# Patient Record
Sex: Female | Born: 1950 | Race: Black or African American | Hispanic: No | Marital: Married | Smoking: Current every day smoker
Health system: Southern US, Community
[De-identification: ages and names within clinical notes are randomized; demographics above are authoritative.]

## PROBLEM LIST (undated history)

## (undated) DIAGNOSIS — J449 Chronic obstructive pulmonary disease, unspecified: Secondary | ICD-10-CM

## (undated) DIAGNOSIS — I10 Essential (primary) hypertension: Secondary | ICD-10-CM

## (undated) HISTORY — PX: TUBAL LIGATION: SHX77

---

## 2015-05-01 ENCOUNTER — Emergency Department (HOSPITAL_BASED_OUTPATIENT_CLINIC_OR_DEPARTMENT_OTHER): Payer: BLUE CROSS/BLUE SHIELD

## 2015-05-01 ENCOUNTER — Encounter (HOSPITAL_BASED_OUTPATIENT_CLINIC_OR_DEPARTMENT_OTHER): Payer: Self-pay | Admitting: *Deleted

## 2015-05-01 ENCOUNTER — Emergency Department (HOSPITAL_BASED_OUTPATIENT_CLINIC_OR_DEPARTMENT_OTHER)
Admission: EM | Admit: 2015-05-01 | Discharge: 2015-05-01 | Disposition: A | Discharge status: Home / Self Care | Payer: BLUE CROSS/BLUE SHIELD | Attending: Emergency Medicine | Admitting: Emergency Medicine

## 2015-05-01 DIAGNOSIS — J441 Chronic obstructive pulmonary disease with (acute) exacerbation: Secondary | ICD-10-CM | POA: Diagnosis not present

## 2015-05-01 DIAGNOSIS — R0789 Other chest pain: Secondary | ICD-10-CM | POA: Insufficient documentation

## 2015-05-01 DIAGNOSIS — I10 Essential (primary) hypertension: Secondary | ICD-10-CM | POA: Diagnosis not present

## 2015-05-01 DIAGNOSIS — F17210 Nicotine dependence, cigarettes, uncomplicated: Secondary | ICD-10-CM | POA: Diagnosis not present

## 2015-05-01 DIAGNOSIS — R0602 Shortness of breath: Secondary | ICD-10-CM | POA: Diagnosis present

## 2015-05-01 DIAGNOSIS — R0600 Dyspnea, unspecified: Secondary | ICD-10-CM

## 2015-05-01 HISTORY — DX: Essential (primary) hypertension: I10

## 2015-05-01 HISTORY — DX: Chronic obstructive pulmonary disease, unspecified: J44.9

## 2015-05-01 LAB — COMPREHENSIVE METABOLIC PANEL
ALT: 24 U/L (ref 14–54)
ANION GAP: 6 (ref 5–15)
AST: 32 U/L (ref 15–41)
Albumin: 4.1 g/dL (ref 3.5–5.0)
Alkaline Phosphatase: 96 U/L (ref 38–126)
BUN: 10 mg/dL (ref 6–20)
CALCIUM: 9.1 mg/dL (ref 8.9–10.3)
CHLORIDE: 102 mmol/L (ref 101–111)
CO2: 31 mmol/L (ref 22–32)
CREATININE: 0.8 mg/dL (ref 0.44–1.00)
Glucose, Bld: 105 mg/dL — ABNORMAL HIGH (ref 65–99)
Potassium: 3.3 mmol/L — ABNORMAL LOW (ref 3.5–5.1)
SODIUM: 139 mmol/L (ref 135–145)
Total Bilirubin: 0.6 mg/dL (ref 0.3–1.2)
Total Protein: 8.3 g/dL — ABNORMAL HIGH (ref 6.5–8.1)

## 2015-05-01 LAB — CBC WITH DIFFERENTIAL/PLATELET
Basophils Absolute: 0 10*3/uL (ref 0.0–0.1)
Basophils Relative: 0 %
EOS ABS: 0.1 10*3/uL (ref 0.0–0.7)
EOS PCT: 1 %
HCT: 43.2 % (ref 36.0–46.0)
Hemoglobin: 13.3 g/dL (ref 12.0–15.0)
LYMPHS ABS: 1.5 10*3/uL (ref 0.7–4.0)
LYMPHS PCT: 16 %
MCH: 25.7 pg — AB (ref 26.0–34.0)
MCHC: 30.8 g/dL (ref 30.0–36.0)
MCV: 83.6 fL (ref 78.0–100.0)
MONO ABS: 1.2 10*3/uL — AB (ref 0.1–1.0)
MONOS PCT: 13 %
Neutro Abs: 6.7 10*3/uL (ref 1.7–7.7)
Neutrophils Relative %: 70 %
PLATELETS: 164 10*3/uL (ref 150–400)
RBC: 5.17 MIL/uL — ABNORMAL HIGH (ref 3.87–5.11)
RDW: 15.3 % (ref 11.5–15.5)
WBC: 9.5 10*3/uL (ref 4.0–10.5)

## 2015-05-01 LAB — TROPONIN I

## 2015-05-01 MED ORDER — PREDNISONE 10 MG PO TABS: 20.0000 mg | ORAL_TABLET | Freq: Two times a day (BID) | ORAL | Status: AC

## 2015-05-01 MED ORDER — ALBUTEROL SULFATE HFA 108 (90 BASE) MCG/ACT IN AERS
2.0000 | INHALATION_SPRAY | RESPIRATORY_TRACT | Status: DC | PRN
  Administered 2015-05-01: 2 via RESPIRATORY_TRACT
  Filled 2015-05-01: qty 6.7

## 2015-05-01 MED ORDER — ALBUTEROL SULFATE (2.5 MG/3ML) 0.083% IN NEBU
5.0000 mg | INHALATION_SOLUTION | Freq: Once | RESPIRATORY_TRACT | Status: AC
  Administered 2015-05-01: 5 mg via RESPIRATORY_TRACT
  Filled 2015-05-01: qty 6

## 2015-05-01 MED ORDER — METHYLPREDNISOLONE SODIUM SUCC 125 MG IJ SOLR
125.0000 mg | Freq: Once | INTRAMUSCULAR | Status: AC
  Administered 2015-05-01: 125 mg via INTRAVENOUS
  Filled 2015-05-01: qty 2

## 2015-05-01 MED ORDER — ALBUTEROL SULFATE (2.5 MG/3ML) 0.083% IN NEBU
5.0000 mg | INHALATION_SOLUTION | Freq: Once | RESPIRATORY_TRACT | Status: DC
  Filled 2015-05-01: qty 6

## 2015-05-01 MED ORDER — ALBUTEROL SULFATE (2.5 MG/3ML) 0.083% IN NEBU
5.0000 mg | INHALATION_SOLUTION | Freq: Once | RESPIRATORY_TRACT | Status: AC
  Administered 2015-05-01: 5 mg via RESPIRATORY_TRACT

## 2015-05-01 NOTE — ED Notes (Signed)
Instructed patient of use of albuterol mdi with spacer. No questions at this time.

## 2015-05-01 NOTE — Discharge Instructions (Signed)
Prednisone as prescribed.  Continue your albuterol as needed for wheezing.  Call the cardiology clinic to schedule a follow-up appointment to discuss a stress test.  Return to the ER symptoms significantly worsen or change.   Nonspecific Chest Pain  Chest pain can be caused by many different conditions. There is always a chance that your pain could be related to something serious, such as a heart attack or a blood clot in your lungs. Chest pain can also be caused by conditions that are not life-threatening. If you have chest pain, it is very important to follow up with your health care provider. CAUSES  Chest pain can be caused by:  Heartburn.  Pneumonia or bronchitis.  Anxiety or stress.  Inflammation around your heart (pericarditis) or lung (pleuritis or pleurisy).  A blood clot in your lung.  A collapsed lung (pneumothorax). It can develop suddenly on its own (spontaneous pneumothorax) or from trauma to the chest.  Shingles infection (varicella-zoster virus).  Heart attack.  Damage to the bones, muscles, and cartilage that make up your chest wall. This can include:  Bruised bones due to injury.  Strained muscles or cartilage due to frequent or repeated coughing or overwork.  Fracture to one or more ribs.  Sore cartilage due to inflammation (costochondritis). RISK FACTORS  Risk factors for chest pain may include:  Activities that increase your risk for trauma or injury to your chest.  Respiratory infections or conditions that cause frequent coughing.  Medical conditions or overeating that can cause heartburn.  Heart disease or family history of heart disease.  Conditions or health behaviors that increase your risk of developing a blood clot.  Having had chicken pox (varicella zoster). SIGNS AND SYMPTOMS Chest pain can feel like:  Burning or tingling on the surface of your chest or deep in your chest.  Crushing, pressure, aching, or squeezing pain.  Dull  or sharp pain that is worse when you move, cough, or take a deep breath.  Pain that is also felt in your back, neck, shoulder, or arm, or pain that spreads to any of these areas. Your chest pain may come and go, or it may stay constant. DIAGNOSIS Lab tests or other studies may be needed to find the cause of your pain. Your health care provider may have you take a test called an ambulatory ECG (electrocardiogram). An ECG records your heartbeat patterns at the time the test is performed. You may also have other tests, such as:  Transthoracic echocardiogram (TTE). During echocardiography, sound waves are used to create a picture of all of the heart structures and to look at how blood flows through your heart.  Transesophageal echocardiogram (TEE).This is a more advanced imaging test that obtains images from inside your body. It allows your health care provider to see your heart in finer detail.  Cardiac monitoring. This allows your health care provider to monitor your heart rate and rhythm in real time.  Holter monitor. This is a portable device that records your heartbeat and can help to diagnose abnormal heartbeats. It allows your health care provider to track your heart activity for several days, if needed.  Stress tests. These can be done through exercise or by taking medicine that makes your heart beat more quickly.  Blood tests.  Imaging tests. TREATMENT  Your treatment depends on what is causing your chest pain. Treatment may include:  Medicines. These may include:  Acid blockers for heartburn.  Anti-inflammatory medicine.  Pain medicine for inflammatory conditions.  Antibiotic medicine, if an infection is present.  Medicines to dissolve blood clots.  Medicines to treat coronary artery disease.  Supportive care for conditions that do not require medicines. This may include:  Resting.  Applying heat or cold packs to injured areas.  Limiting activities until pain  decreases. HOME CARE INSTRUCTIONS  If you were prescribed an antibiotic medicine, finish it all even if you start to feel better.  Avoid any activities that bring on chest pain.  Do not use any tobacco products, including cigarettes, chewing tobacco, or electronic cigarettes. If you need help quitting, ask your health care provider.  Do not drink alcohol.  Take medicines only as directed by your health care provider.  Keep all follow-up visits as directed by your health care provider. This is important. This includes any further testing if your chest pain does not go away.  If heartburn is the cause for your chest pain, you may be told to keep your head raised (elevated) while sleeping. This reduces the chance that acid will go from your stomach into your esophagus.  Make lifestyle changes as directed by your health care provider. These may include:  Getting regular exercise. Ask your health care provider to suggest some activities that are safe for you.  Eating a heart-healthy diet. A registered dietitian can help you to learn healthy eating options.  Maintaining a healthy weight.  Managing diabetes, if necessary.  Reducing stress. SEEK MEDICAL CARE IF:  Your chest pain does not go away after treatment.  You have a rash with blisters on your chest.  You have a fever. SEEK IMMEDIATE MEDICAL CARE IF:   Your chest pain is worse.  You have an increasing cough, or you cough up blood.  You have severe abdominal pain.  You have severe weakness.  You faint.  You have chills.  You have sudden, unexplained chest discomfort.  You have sudden, unexplained discomfort in your arms, back, neck, or jaw.  You have shortness of breath at any time.  You suddenly start to sweat, or your skin gets clammy.  You feel nauseous or you vomit.  You suddenly feel light-headed or dizzy.  Your heart begins to beat quickly, or it feels like it is skipping beats. These symptoms may  represent a serious problem that is an emergency. Do not wait to see if the symptoms will go away. Get medical help right away. Call your local emergency services (911 in the [place] Do not drive yourself to the hospital.   This information is not intended to replace advice given to you by your health care provider. Make sure you discuss any questions you have with your health care provider.   Document Released: 02/12/2005 Document Revised: 05/26/2014 Document Reviewed: 12/09/2013 Elsevier Interactive Patient Education [employer].

## 2015-05-01 NOTE — ED Provider Notes (Addendum)
CSN: [id number]     Arrival date & time 05/01/15  0813 History   First MD Initiated Contact with Patient 05/01/15 [phone]     Chief Complaint  Patient presents with  . Shortness of Breath     (Consider location/radiation/quality/duration/timing/severity/associated sxs/prior Treatment) HPI Comments: Patient is a 64 year old female with history of COPD, hypertension. She presents for evaluation of URI like symptoms including congestion, cough, fatigue. This is become worse over the past several days. She also reports that she has been having episodes of chest discomfort over the past several weeks. These episodes occur spontaneously and are not related to exertion or stress. She reports a sharp pain in her left upper chest that comes on and lasts for several minutes, then resolves. She denies any nausea, diaphoresis, or radiation to the arm or jaw when this discomfort occurs. She has felt somewhat short of breath recently. She has no calf pain or swelling. She denies any fevers or chills.  Patient is a 64 y.o. female presenting with shortness of breath. The history is provided by the patient.  Shortness of Breath Severity:  Moderate Onset quality:  Sudden Duration:  3 days Timing:  Constant Progression:  Worsening Chronicity:  New Relieved by:  Nothing Worsened by:  Nothing tried Ineffective treatments:  None tried   Past Medical History  Diagnosis Date  . COPD (chronic obstructive pulmonary disease) (HCC)   . Hypertension    Past Surgical History  Procedure Laterality Date  . Tubal ligation     No family history on file. Social History  Substance Use Topics  . Smoking status: Current Every Day Smoker -- 1.00 packs/day    Types: Cigarettes  . Smokeless tobacco: Never Used  . Alcohol Use: No   OB History    No data available     Review of Systems  Respiratory: Positive for shortness of breath.   All other systems reviewed and are negative.     Allergies  Review of  patient's allergies indicates no known allergies.  Home Medications   Prior to Admission medications   Medication Sig Start Date End Date Taking? Authorizing Provider  Cholecalciferol (VITAMIN D PO) Take by mouth.   Yes Historical Provider, MD  DiltiaZEM HCl ER Beads (TAZTIA XT PO) Take by mouth.   Yes Historical Provider, MD  HYDRALAZINE-HCTZ PO Take by mouth.   Yes Historical Provider, MD  Rosuvastatin Calcium (CRESTOR PO) Take by mouth.   Yes Historical Provider, MD   BP 139/84 mmHg  Pulse 114  Temp(Src) 98.6 F (37 C) (Oral)  Resp 18  Ht  (1.702 m)  Wt 196 lb (88.905 kg)  BMI 30.69 kg/m2  SpO2 89% Physical Exam  Constitutional: She is oriented to person, place, and time. She appears well-developed and well-nourished. No distress.  HENT:  Head: Normocephalic and atraumatic.  Neck: Normal range of motion. Neck supple.  Cardiovascular: Normal rate and regular rhythm.  Exam reveals no gallop and no friction rub.   No murmur heard. Pulmonary/Chest: Effort normal and breath sounds normal. No respiratory distress. She has no wheezes.  Abdominal: Soft. Bowel sounds are normal. She exhibits no distension. There is no tenderness.  Musculoskeletal: Normal range of motion. She exhibits no edema.  There is no calf pain, tenderness, or swelling. Homans sign is absent bilaterally.  Neurological: She is alert and oriented to person, place, and time.  Skin: Skin is warm and dry. She is not diaphoretic.  Nursing note and vitals reviewed.  ED Course  Procedures (including critical care time) Labs Review Labs Reviewed  CBC WITH DIFFERENTIAL/PLATELET - Abnormal; Notable for the following:    RBC 5.17 (*)    MCH 25.7 (*)    Monocytes Absolute 1.2 (*)    All other components within normal limits  COMPREHENSIVE METABOLIC PANEL  TROPONIN I    Imaging Review No results found. I have personally reviewed and evaluated these images and lab results as part of my medical  decision-making.  ED ECG REPORT   Date: 05/01/2015  Rate: 84  Rhythm: normal sinus rhythm  QRS Axis: normal  Intervals: normal  ST/T Wave abnormalities: normal  Conduction Disutrbances:none  Narrative Interpretation:   Old EKG Reviewed: none available  I have personally reviewed the EKG tracing and agree with the computerized printout as noted.   MDM   Final diagnoses:  None    Patient presents with complaints of intermittent sharp pain in her left upper chest which is been occurring for the past 2 months. She presents today with URI symptoms and feeling short of breath. Her workup reveals no acute abnormality. Her troponin is negative and laboratory studies are otherwise unremarkable. Her chest x-ray is clear. I doubt a cardiac etiology and suspect this is likely more musculoskeletal. She had mild hypoxia with ambulation and she was given a nebulizer treatment. She is now feeling much improved and I believe is appropriate for discharge. She will be treated with albuterol, steroids, and when necessary return.  I have considered pulmonary embolism, however highly doubt this as her history is inconsistent with PE.    Geoffery Lyonsouglas Denney Shein, MD 05/01/15 [phone]  Geoffery Lyonsouglas Haeley Fordham, MD 05/01/15 [phone]

## 2015-05-01 NOTE — ED Notes (Signed)
Pt up to bathroom, sats 88% when returned to room placed back on 2lpm [place].

## 2015-05-01 NOTE — ED Notes (Signed)
Patient states she developed sinus congestion, fatigue, chills, sneezing, and cough.  States she has progressively gotten worse.  Patient states she has had intermittent chest pain on multiple occassions, and states she is very concerned about her heart.  States she works for [employer], and was taken to the ED by her co-workers, but refused to be seen for her chest pain over the weekend.

## 2017-12-23 ENCOUNTER — Other Ambulatory Visit: Payer: Self-pay | Admitting: Surgical Oncology

## 2017-12-23 DIAGNOSIS — R921 Mammographic calcification found on diagnostic imaging of breast: Secondary | ICD-10-CM

## 2017-12-28 ENCOUNTER — Ambulatory Visit
Admission: RE | Admit: 2017-12-28 | Discharge: 2017-12-28 | Disposition: A | Discharge status: Home / Self Care | Payer: BLUE CROSS/BLUE SHIELD | Source: Ambulatory Visit | Attending: Surgical Oncology | Admitting: Surgical Oncology

## 2017-12-28 DIAGNOSIS — R921 Mammographic calcification found on diagnostic imaging of breast: Secondary | ICD-10-CM

## 2019-04-11 IMAGING — MG STEREOTACTIC VACUUM ASSIST RIGHT
8 of 11 series · 8 of 23 positions shown · non-contrast
Comparison: Previous exams.

ADDENDUM:
Pathology revealed FIBROADENOMA WITH CALCIFICATIONS, FIBROCYSTIC
CHANGES of RIGHT breast, upper outer. This was found to be
concordant by Dr. Bondo Daar.

Pathology results were provided to Wai Kuong Amelis, Breast Navigator,
[facility] [REDACTED], [HOSPITAL] [HOSPITAL], who will
discuss results with the patient by telephone.
The patient has a previous diagnosis of LEFT breast cancer and
should follow her outlined treatment plan.
Pathology and imaging reports will be faxed to Wai Kuong Amelis.
Pathology results reported by Kyu Barreda, RN on 12/29/2017.
CLINICAL DATA: Biopsy of right breast calcifications
EXAM:
RIGHT BREAST STEREOTACTIC CORE NEEDLE BIOPSY

[R (1 of 7)]
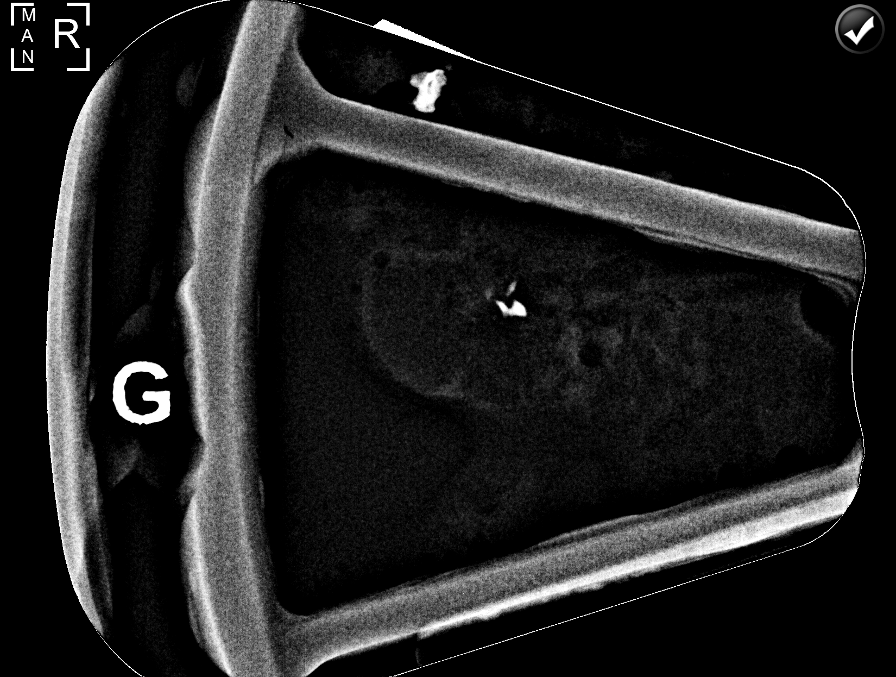

[R (2 of 7)]
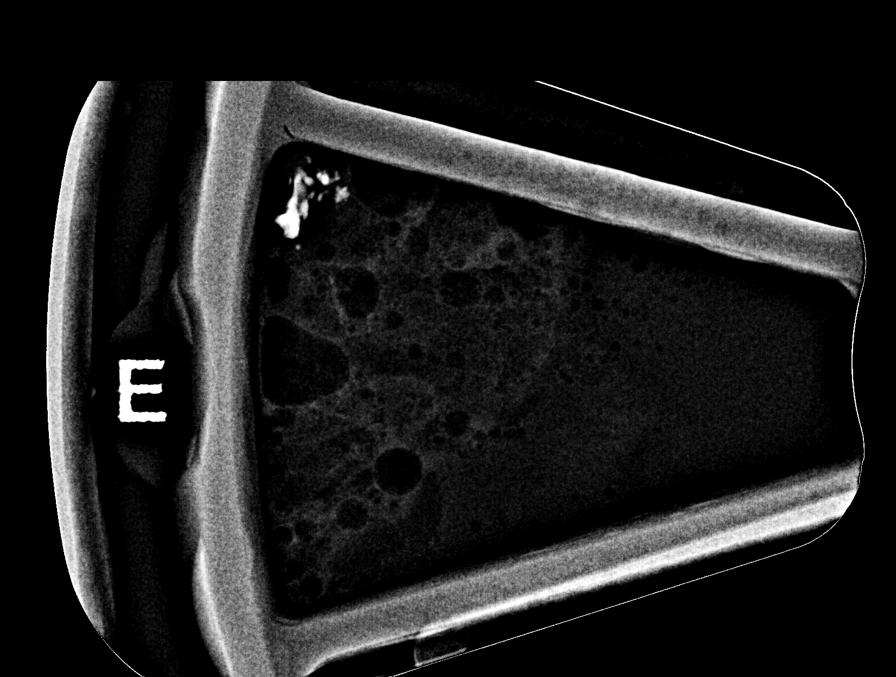

[R (3 of 7)]
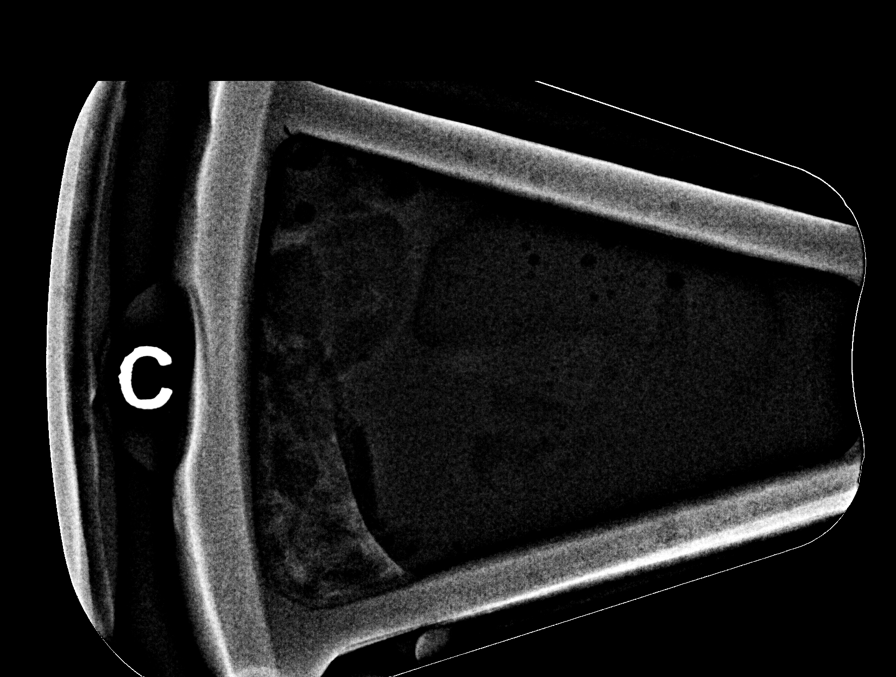

[R (4 of 7)]
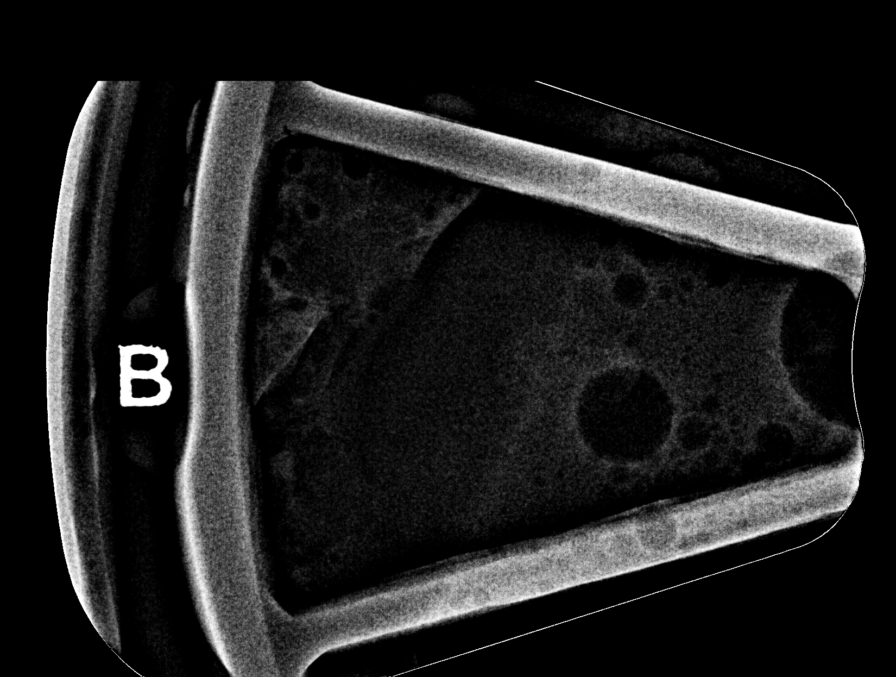

[R (5 of 7)]
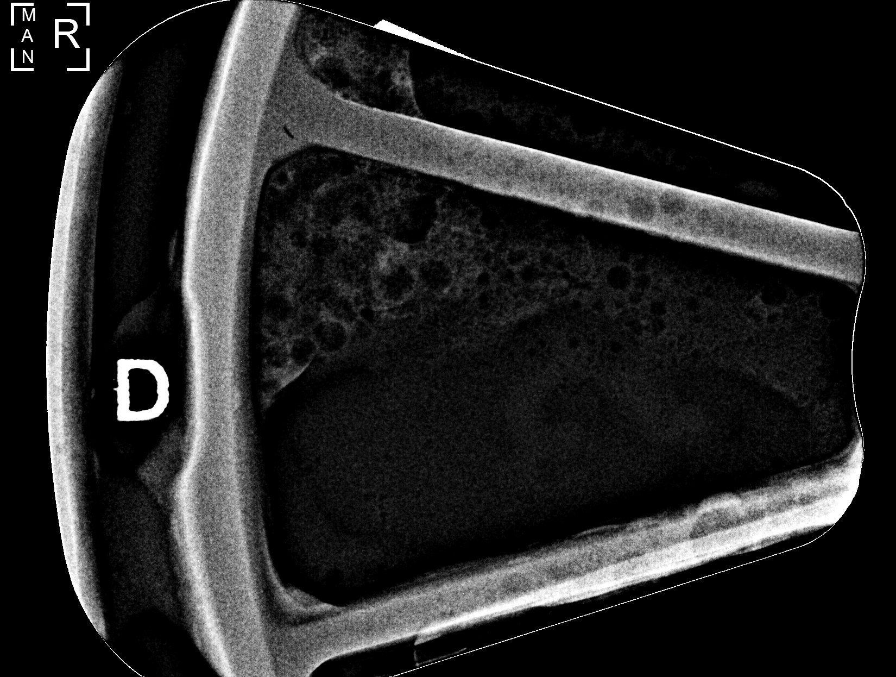

[R (6 of 7)]
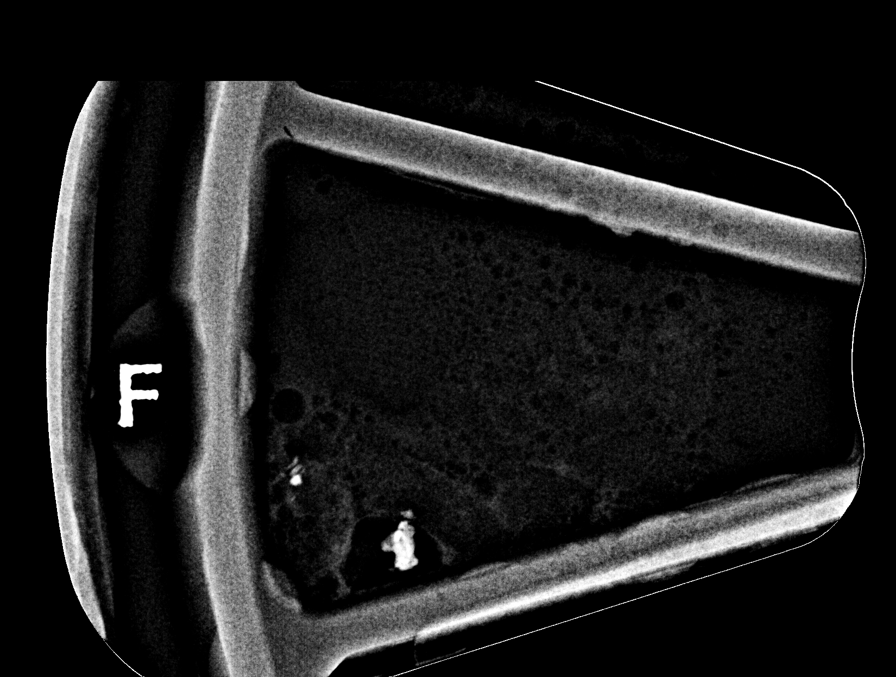

[R (7 of 7)]
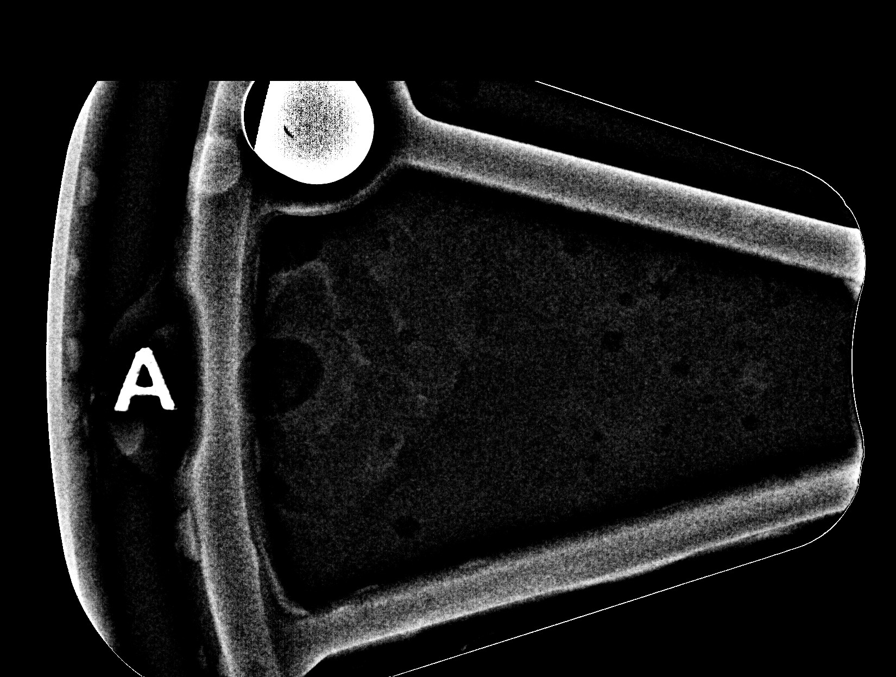

[R CC]
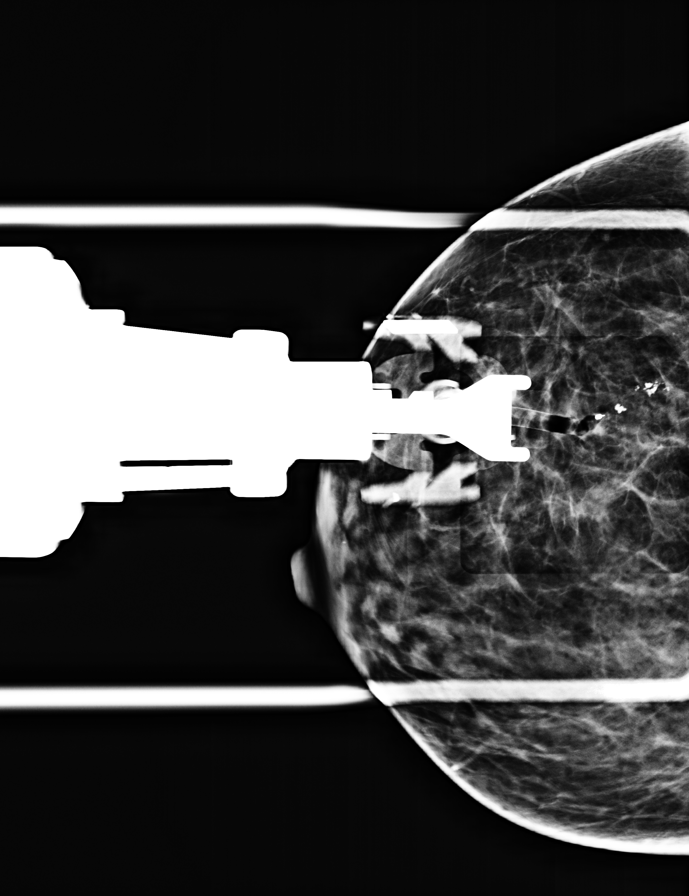

[8 of 23 positions shown; findings below may reference images not displayed]



Using sterile technique and 1% Lidocaine as local anesthetic, under
stereotactic guidance, a 9 gauge vacuum assisted device was used to
perform core needle biopsy of calcifications in the upper outer
right breast using a superior approach. Specimen radiograph was
performed showing calcifications within at least 3 core specimens.
Specimens with calcifications are identified for pathology.

Lesion quadrant: Upper-outer

At the conclusion of the procedure, a tissue marker clip was
deployed into the biopsy cavity. Follow-up 2-view mammogram was
performed and dictated separately.
IMPRESSION: Stereotactic-guided biopsy of calcifications in the upper outer
right breast. No apparent complications.

## 2023-01-18 DEATH — deceased
